# Patient Record
Sex: Female | Born: 2000 | Race: Black or African American | Hispanic: No | Marital: Single | Smoking: Never smoker
Health system: Southern US, Community
[De-identification: ages and names within clinical notes are randomized; demographics above are authoritative.]

---
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
## 2021-12-11 ENCOUNTER — Other Ambulatory Visit: Payer: Self-pay

## 2021-12-11 ENCOUNTER — Encounter (HOSPITAL_COMMUNITY): Payer: Self-pay

## 2021-12-11 ENCOUNTER — Emergency Department (HOSPITAL_COMMUNITY)
Admission: EM | Admit: 2021-12-11 | Discharge: 2021-12-12 | Disposition: A | Discharge status: Home / Self Care | Payer: Medicaid Other | Attending: Emergency Medicine | Admitting: Emergency Medicine

## 2021-12-11 ENCOUNTER — Emergency Department (HOSPITAL_COMMUNITY): Payer: Medicaid Other

## 2021-12-11 DIAGNOSIS — R1013 Epigastric pain: Secondary | ICD-10-CM | POA: Diagnosis present

## 2021-12-11 DIAGNOSIS — K529 Noninfective gastroenteritis and colitis, unspecified: Secondary | ICD-10-CM | POA: Insufficient documentation

## 2021-12-11 LAB — URINALYSIS, ROUTINE W REFLEX MICROSCOPIC
Bilirubin Urine: NEGATIVE
Glucose, UA: NEGATIVE mg/dL
Ketones, ur: NEGATIVE mg/dL
Nitrite: NEGATIVE
Protein, ur: NEGATIVE mg/dL
Specific Gravity, Urine: 1.012 (ref 1.005–1.030)
pH: 5 (ref 5.0–8.0)

## 2021-12-11 LAB — COMPREHENSIVE METABOLIC PANEL
ALT: 9 U/L (ref 0–44)
AST: 14 U/L — ABNORMAL LOW (ref 15–41)
Albumin: 3.7 g/dL (ref 3.5–5.0)
Alkaline Phosphatase: 54 U/L (ref 38–126)
Anion gap: 8 (ref 5–15)
BUN: 11 mg/dL (ref 6–20)
CO2: 25 mmol/L (ref 22–32)
Calcium: 8.9 mg/dL (ref 8.9–10.3)
Chloride: 103 mmol/L (ref 98–111)
Creatinine, Ser: 0.65 mg/dL (ref 0.44–1.00)
GFR, Estimated: >60 mL/min (ref >60)
Glucose, Bld: 103 mg/dL — ABNORMAL HIGH (ref 70–99)
Potassium: 3.7 mmol/L (ref 3.5–5.1)
Sodium: 136 mmol/L (ref 135–145)
Total Bilirubin: 0.5 mg/dL (ref 0.3–1.2)
Total Protein: 7.6 g/dL (ref 6.5–8.1)

## 2021-12-11 LAB — CBC WITH DIFFERENTIAL/PLATELET
Abs Immature Granulocytes: 0.05 10*3/uL (ref 0.00–0.07)
Basophils Absolute: 0 10*3/uL (ref 0.0–0.1)
Basophils Relative: 0 %
Eosinophils Absolute: 0 10*3/uL (ref 0.0–0.5)
Eosinophils Relative: 0 %
HCT: 30.6 % — ABNORMAL LOW (ref 36.0–46.0)
Hemoglobin: 10.1 g/dL — ABNORMAL LOW (ref 12.0–15.0)
Immature Granulocytes: 0 %
Lymphocytes Relative: 7 %
Lymphs Abs: 0.8 10*3/uL (ref 0.7–4.0)
MCH: 26.2 pg (ref 26.0–34.0)
MCHC: 33 g/dL (ref 30.0–36.0)
MCV: 79.5 fL — ABNORMAL LOW (ref 80.0–100.0)
Monocytes Absolute: 0.8 10*3/uL (ref 0.1–1.0)
Monocytes Relative: 7 %
Neutro Abs: 9.9 10*3/uL — ABNORMAL HIGH (ref 1.7–7.7)
Neutrophils Relative %: 86 %
Platelets: 338 10*3/uL (ref 150–400)
RBC: 3.85 MIL/uL — ABNORMAL LOW (ref 3.87–5.11)
RDW: 14.9 % (ref 11.5–15.5)
WBC: 11.6 10*3/uL — ABNORMAL HIGH (ref 4.0–10.5)
nRBC: 0 % (ref 0.0–0.2)

## 2021-12-11 LAB — I-STAT BETA HCG BLOOD, ED (MC, WL, AP ONLY): I-stat hCG, quantitative: <5 m[IU]/mL (ref <5)

## 2021-12-11 LAB — LIPASE, BLOOD: Lipase: 23 U/L (ref 11–51)

## 2021-12-11 MED ORDER — IOHEXOL 300 MG/ML  SOLN
100.0000 mL | Freq: Once | INTRAMUSCULAR | Status: AC | PRN
  Administered 2021-12-11: 80 mL via INTRAVENOUS

## 2021-12-11 NOTE — ED Triage Notes (Signed)
Pt arrives via EMS from home. Pt c/o abdominal cramping that started today. Denies any associated symptoms

## 2021-12-11 NOTE — ED Provider Triage Note (Signed)
Emergency Medicine Provider Triage Evaluation Note  Kelly Cisneros , a 21 y.o. female  was evaluated in triage.  Pt complains of abdominal pain.  Patient states that symptoms began Monday night.  She notes progressively worsening symptoms since around.  She states that symptoms began after eating [employer] around 1 PM on Monday.  She describes the pain as right upper quadrant in location without radiation.  She denies association with food since symptom onset.  She denies nausea, vomiting, fever, chills, night sweats, urinary/vaginal symptoms, medic easier, melena.  She does note that she has been having diarrhea for the past day.  Last menstrual period August 8.  She was seen by her primary care provider yesterday and was diagnosed with GERD and started on famotidine.  She states the medication has not helped and she has taken it once.  Review of Systems  Positive: See above Negative:   Physical Exam  BP 120/89 (BP Location: Right Arm)   Pulse (!) 106   Temp 100.3 F (37.9 C) (Oral)   Resp 20   SpO2 98%  Gen:   Awake, no distress   Resp:  Normal effort  MSK:   Moves extremities without difficulty  Other:  Mild epigastric tenderness on exam.  Medical Decision Making  Medically screening exam initiated at 7:25 PM.  Appropriate orders placed.  Kelly Cisneros was informed that the remainder of the evaluation will be completed by another provider, this initial triage assessment does not replace that evaluation, and the importance of remaining in the ED until their evaluation is complete.     Peter Garter, [place] 12/11/21 1927

## 2021-12-12 MED ORDER — DICYCLOMINE HCL 20 MG PO TABS: 20.0000 mg | ORAL_TABLET | Freq: Two times a day (BID) | ORAL | 0 refills | Status: DC

## 2021-12-12 MED ORDER — FENTANYL CITRATE PF 50 MCG/ML IJ SOSY
50.0000 ug | PREFILLED_SYRINGE | Freq: Once | INTRAMUSCULAR | Status: AC
  Administered 2021-12-12: 50 ug via INTRAVENOUS
  Filled 2021-12-12: qty 1

## 2021-12-12 MED ORDER — FAMOTIDINE 20 MG PO TABS: 20.0000 mg | ORAL_TABLET | Freq: Two times a day (BID) | ORAL | 0 refills | Status: DC

## 2021-12-12 MED ORDER — ONDANSETRON HCL 4 MG/2ML IJ SOLN
4.0000 mg | Freq: Once | INTRAMUSCULAR | Status: AC
  Administered 2021-12-12: 4 mg via INTRAVENOUS
  Filled 2021-12-12: qty 2

## 2021-12-12 MED ORDER — FAMOTIDINE 20 MG PO TABS
20.0000 mg | ORAL_TABLET | Freq: Two times a day (BID) | ORAL | 0 refills | Status: DC
Start: 2021-12-12 — End: 2021-12-12

## 2021-12-12 MED ORDER — SODIUM CHLORIDE 0.9 % IV BOLUS
1000.0000 mL | Freq: Once | INTRAVENOUS | Status: AC
  Administered 2021-12-12: 1000 mL via INTRAVENOUS

## 2021-12-12 MED ORDER — ONDANSETRON HCL 4 MG PO TABS: 4.0000 mg | ORAL_TABLET | Freq: Three times a day (TID) | ORAL | 0 refills | Status: DC | PRN

## 2021-12-12 NOTE — Discharge Instructions (Addendum)
Get help right away if you: Have chest pain. Have trouble breathing or you are breathing very quickly. Have a fast heartbeat. Feel extremely weak or you faint. Have a severe headache, a stiff neck, or both. Have a rash. Have severe pain, cramping, or bloating in your abdomen. Have skin that feels cold and clammy. Feel confused. Have pain when you urinate. Have signs of dehydration, such as: Dark urine, very little urine, or no urine. Cracked lips. Dry mouth. Sunken eyes. Sleepiness. Weakness. Have signs of bleeding, such as: Seeing blood in your vomit. Having vomit that looks like coffee grounds. Having bloody or black stools or stools that look like tar. 

## 2021-12-12 NOTE — ED Provider Notes (Signed)
[facility]-EMERGENCY DEPT Provider Note   CSN: [id number] Arrival date & time: 12/11/21  1847     History  Chief Complaint  Patient presents with   Abdominal Pain    Kelly Cisneros is a 21 y.o. female.  The history is provided by the patient.  Abdominal Pain Pain location:  Epigastric Pain quality: aching   Pain radiates to:  Does not radiate Pain severity:  Moderate Onset quality:  Gradual Duration:  2 days Timing:  Constant Progression:  Unchanged Chronicity:  New Context: not alcohol use, not awakening from sleep, not diet changes, not eating, not laxative use, not medication withdrawal, not previous surgeries, not recent illness, not recent sexual activity, not recent travel, not retching, not sick contacts, not suspicious food intake and not trauma   Relieved by:  None tried Worsened by:  Nothing Ineffective treatments:  None tried Associated symptoms: no chest pain, no chills, no constipation, no cough, no diarrhea, no dysuria, no fatigue, no fever, no hematemesis, no hematuria, no melena, no nausea, no sore throat and no vomiting        Home Medications Prior to Admission medications   Not on File      Allergies    Patient has no known allergies.    Review of Systems   Review of Systems  Constitutional:  Negative for chills, fatigue and fever.  HENT:  Negative for sore throat.   Respiratory:  Negative for cough.   Cardiovascular:  Negative for chest pain.  Gastrointestinal:  Positive for abdominal pain. Negative for constipation, diarrhea, hematemesis, melena, nausea and vomiting.  Genitourinary:  Negative for dysuria and hematuria.    Physical Exam Updated Vital Signs BP 111/74   Pulse 97   Temp 99.7 F (37.6 C) (Oral)   Resp 17   SpO2 100%  Physical Exam Vitals and nursing note reviewed.  Constitutional:      General: She is not in acute distress.    Appearance: She is well-developed. She is not diaphoretic.  HENT:      Head: Normocephalic and atraumatic.     Right Ear: External ear normal.     Left Ear: External ear normal.     Nose: Nose normal.     Mouth/Throat:     Mouth: Mucous membranes are moist.  Eyes:     General: No scleral icterus.    Conjunctiva/sclera: Conjunctivae normal.  Cardiovascular:     Rate and Rhythm: Normal rate and regular rhythm.     Heart sounds: Normal heart sounds. No murmur heard.    No friction rub. No gallop.  Pulmonary:     Effort: Pulmonary effort is normal. No respiratory distress.     Breath sounds: Normal breath sounds.  Abdominal:     General: Bowel sounds are normal. There is no distension.     Palpations: Abdomen is soft. There is no mass.     Tenderness: There is generalized abdominal tenderness. There is no guarding.  Musculoskeletal:     Cervical back: Normal range of motion.  Skin:    General: Skin is warm and dry.  Neurological:     Mental Status: She is alert and oriented to person, place, and time.  Psychiatric:        Behavior: Behavior normal.     ED Results / Procedures / Treatments   Labs (all labs ordered are listed, but only abnormal results are displayed) Labs Reviewed  CBC WITH DIFFERENTIAL/PLATELET - Abnormal; Notable for the following components:  Result Value   WBC 11.6 (*)    RBC 3.85 (*)    Hemoglobin 10.1 (*)    HCT 30.6 (*)    MCV 79.5 (*)    Neutro Abs 9.9 (*)    All other components within normal limits  COMPREHENSIVE METABOLIC PANEL - Abnormal; Notable for the following components:   Glucose, Bld 103 (*)    AST 14 (*)    All other components within normal limits  URINALYSIS, ROUTINE W REFLEX MICROSCOPIC - Abnormal; Notable for the following components:   APPearance HAZY (*)    Hgb urine dipstick MODERATE (*)    Leukocytes,Ua MODERATE (*)    Bacteria, UA RARE (*)    All other components within normal limits  LIPASE, BLOOD  I-STAT BETA HCG BLOOD, ED ([facility], [facility], [facility] ONLY)  I-STAT BETA HCG BLOOD, ED ([facility], [facility], [facility] ONLY)     EKG None  Radiology CT Abdomen Pelvis W Contrast  Result Date: 12/11/2021 CLINICAL DATA:  Abdominal cramping. EXAM: CT ABDOMEN AND PELVIS WITH CONTRAST TECHNIQUE: Multidetector CT imaging of the abdomen and pelvis was performed using the standard protocol following bolus administration of intravenous contrast. RADIATION DOSE REDUCTION: This exam was performed according to the departmental dose-optimization program which includes automated exposure control, adjustment of the mA and/or kV according to patient size and/or use of iterative reconstruction technique. CONTRAST:  46mL OMNIPAQUE IOHEXOL 300 MG/ML  SOLN COMPARISON:  None Available. FINDINGS: Lower chest: No acute abnormality. Hepatobiliary: No focal liver abnormality is seen. No gallstones, gallbladder wall thickening, or biliary dilatation. Pancreas: Unremarkable. No pancreatic ductal dilatation or surrounding inflammatory changes. Spleen: Normal in size without focal abnormality. Adrenals/Urinary Tract: Adrenal glands are unremarkable. Kidneys are normal, without renal calculi, focal lesion, or hydronephrosis. Bladder is unremarkable. Stomach/Bowel: Stomach is within normal limits. Appendix appears normal. No evidence of bowel dilatation. Mildly thickened and inflamed loops of jejunum are seen the mid and lower abdomen. Vascular/Lymphatic: No significant vascular findings are present. No enlarged abdominal or pelvic lymph nodes. Reproductive: Uterus and bilateral adnexa are unremarkable. Other: No abdominal wall hernia or abnormality. No abdominopelvic ascites. Musculoskeletal: No acute or significant osseous findings. IMPRESSION: Mild enteritis involving multiple loops of jejunum. Electronically Signed   By: Aram Candela M.D.   On: 12/11/2021 20:57    Procedures Procedures    Medications Ordered in ED Medications  iohexol (OMNIPAQUE) 300 MG/ML solution 100 mL (80 mLs Intravenous Contrast Given 12/11/21 2028)  sodium chloride 0.9 %  bolus 1,000 mL (1,000 mLs Intravenous New Bag/Given 12/12/21 0157)  fentaNYL (SUBLIMAZE) injection 50 mcg (50 mcg Intravenous Given 12/12/21 0157)  ondansetron (ZOFRAN) injection 4 mg (4 mg Intravenous Given 12/12/21 0157)    ED Course/ Medical Decision Making/ A&P                           Medical Decision Making Patient here with complaint of epigastric abdominal pain for the past 24 hours. Differential diagnosis of epigastric pain includes: Functional or nonulcer dyspepsia PUD, GERD, Gastritis, (NSAIDs, alcohol, stress, H. pylori, pernicious anemia), pancreatitis or pancreatic cancer, overeating indigestion (high-fat foods, coffee), drugs (aspirin, antibiotics (eg, macrolides, metronidazole), corticosteroids, digoxin, narcotics, theophylline), gastroparesis, lactose intolerance, malabsorption gastric cancer, parasitic infection, (Giardia, Strongyloides, Ascaris) cholelithiasis, choledocholithiasis, or cholangitis, ACS, pericarditis, pneumonia, abdominal hernia, pregnancy, intestinal ischemia, esophageal rupture, gastric volvulus, hepatitis.  I reviewed and interpreted the patient's labs which which are reassuring.  I visualized and interpreted CT abdomen and pelvis ordered  at triage which shows findings consistent with enteritis  Patient with symptoms consistent with enteritis.  Vitals are stable, no fever.  Pain addressed and resolved, tolerating PO fluids > 6 oz.  Lungs are clear.   no concern for appendicitis, cholecystitis, pancreatitis, ruptured viscus, UTI, kidney stone, or any other abdominal etiology.  Supportive therapy indicated with return if symptoms worsen.  Patient counseled.   Amount and/or Complexity of Data Reviewed Radiology: independent interpretation performed.  Risk Prescription drug management. Parenteral controlled substances.           Final Clinical Impression(s) / ED Diagnoses Final diagnoses:  Enteritis    Rx / DC Orders ED Discharge Orders      None         Arthor Captain, PA-C 12/12/21 1706    Palumbo, April, MD 12/12/21 2319

## 2022-10-09 ENCOUNTER — Ambulatory Visit
Admission: RE | Admit: 2022-10-09 | Discharge: 2022-10-09 | Disposition: A | Discharge status: Home / Self Care | Payer: Medicaid Other | Source: Ambulatory Visit | Attending: Family Medicine | Admitting: Family Medicine

## 2022-10-09 ENCOUNTER — Other Ambulatory Visit: Payer: Self-pay

## 2022-10-09 VITALS — BP 98/67 | HR 80 | Temp 98.9°F | Resp 16

## 2022-10-09 DIAGNOSIS — N898 Other specified noninflammatory disorders of vagina: Secondary | ICD-10-CM

## 2022-10-09 NOTE — ED Provider Notes (Signed)
[facility]    CSN: [id number] Arrival date & time: 10/09/22  1356      History   Chief Complaint Chief Complaint  Patient presents with   Vaginal Discharge    Entered by patient   Vaginal Itching   Vaginal Pain    HPI Kelly Cisneros is a 22 y.o. female.   HPI  She is seen today with a complaint of vaginal discharge.  She reports that she has recently been treated for bacterial vaginosis along with a yeast infection.  She feels like the symptoms have not resolved.  She does have occasional pain outside of her vaginal area. Denies any pelvic pain or tenderness, amenorrhea irregular bleeding or prolonged heavy bleeding.  Denies dysuria.  Denies ulcers or lesions  History reviewed. No pertinent past medical history.  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 No pertinent surgical history.  OB History   No obstetric history on file.      Home Medications    Prior to Admission medications   Not on File    Family History History reviewed. No pertinent family history.  Social History Social History   Tobacco Use   Smoking status: Never   Smokeless tobacco: Never  Substance Use Topics   Alcohol use: Not Currently   Drug use: Not Currently    Types: Marijuana     Allergies   Patient has no known allergies.   Review of Systems Review of Systems   Physical Exam Triage Vital Signs ED Triage Vitals  Enc Vitals Group     BP 10/09/22 1425 98/67     Pulse Rate 10/09/22 1425 80     Resp 10/09/22 1425 16     Temp 10/09/22 1425 98.9 F (37.2 C)     Temp src --      SpO2 10/09/22 1425 97 %     Weight --      Height --      Head Circumference --      Peak Flow --      Pain Score 10/09/22 1423 4     Pain Loc --      Pain Edu? --       Excl. in GC? --    No data found.  Updated Vital Signs BP 98/67   Pulse 80   Temp 98.9 F (37.2 C)   Resp 16   LMP 09/16/2022   SpO2 97%   Visual Acuity Right Eye Distance:   Left Eye Distance:   Bilateral Distance:    Right Eye Near:   Left Eye Near:    Bilateral Near:     Physical Exam Constitutional:      General: She is not in acute distress.    Appearance: She is normal weight. She is not ill-appearing.  HENT:     Head: Normocephalic and atraumatic.  Cardiovascular:     Rate and Rhythm: Normal rate.  Pulmonary:     Effort: Pulmonary effort is normal.  Genitourinary:    Comments: Deferred Musculoskeletal:        General: Normal range of motion.  Skin:    General: Skin is warm and dry.     Capillary Refill: Capillary refill takes less than 2 seconds.  Neurological:     General:  No focal deficit present.     Mental Status: She is alert and oriented to person, place, and time.  Psychiatric:        Mood and Affect: Mood normal.        Behavior: Behavior normal.      UC Treatments / Results  Labs (all labs ordered are listed, but only abnormal results are displayed) Labs Reviewed  CERVICOVAGINAL ANCILLARY ONLY    EKG   Radiology No results found.  Procedures Procedures (including critical care time)  Medications Ordered in UC Medications - No data to display  Initial Impression / Assessment and Plan / UC Course  I have reviewed the triage vital signs and the nursing notes.  Pertinent labs & imaging results that were available during my care of the patient were reviewed by me and considered in my medical decision making (see chart for details).     Vaginal discharge Final Clinical Impressions(s) / UC Diagnoses   Final diagnoses:  Vaginal discharge     Discharge Instructions      Your vaginal swab is pending. You will be notified by the nurse with any positive results.   A nurse will follow up with you for any positive results for  further treatment recommendations. If your symptoms persist or get worse please follow up here or with your gynecologist for further evaluation       ED Prescriptions   None    PDMP not reviewed this encounter.   Thad Ranger [place], [place] 10/09/22 [phone]

## 2022-10-09 NOTE — ED Triage Notes (Signed)
Pt reports vag itching,discharge and pain for last 2-3 months.

## 2022-10-09 NOTE — Discharge Instructions (Addendum)
Your vaginal swab is pending. You will be notified by the nurse with any positive results.  A nurse will follow up with you for any positive results for further treatment recommendations.  If your symptoms persist or get worse please follow up here or with your gynecologist for further evaluation    

## 2022-10-10 LAB — CERVICOVAGINAL ANCILLARY ONLY
Bacterial Vaginitis (gardnerella): POSITIVE — AB
Candida Glabrata: NEGATIVE
Candida Vaginitis: POSITIVE — AB
Chlamydia: NEGATIVE
Comment: NEGATIVE
Comment: NEGATIVE
Comment: NEGATIVE
Comment: NEGATIVE
Comment: NEGATIVE
Comment: NORMAL
Neisseria Gonorrhea: NEGATIVE
Trichomonas: NEGATIVE

## 2022-10-13 ENCOUNTER — Telehealth (HOSPITAL_COMMUNITY): Payer: Self-pay | Admitting: Emergency Medicine

## 2022-10-13 MED ORDER — FLUCONAZOLE 150 MG PO TABS: 150.0000 mg | ORAL_TABLET | Freq: Once | ORAL | 0 refills | Status: AC

## 2022-10-13 MED ORDER — METRONIDAZOLE 0.75 % VA GEL: 1.0000 | Freq: Every day | VAGINAL | 0 refills | Status: AC

## 2022-11-10 ENCOUNTER — Ambulatory Visit
Admission: RE | Admit: 2022-11-10 | Discharge: 2022-11-10 | Disposition: A | Discharge status: Home / Self Care | Payer: Medicaid Other | Source: Ambulatory Visit | Attending: Emergency Medicine | Admitting: Emergency Medicine

## 2022-11-10 VITALS — BP 106/65 | HR 82 | Temp 98.3°F | Resp 16 | Ht 60.0 in | Wt 95.0 lb

## 2022-11-10 DIAGNOSIS — N898 Other specified noninflammatory disorders of vagina: Secondary | ICD-10-CM | POA: Insufficient documentation

## 2022-11-10 DIAGNOSIS — R102 Pelvic and perineal pain: Secondary | ICD-10-CM | POA: Diagnosis not present

## 2022-11-10 LAB — POCT URINALYSIS DIP (MANUAL ENTRY)
Bilirubin, UA: NEGATIVE
Blood, UA: NEGATIVE
Glucose, UA: NEGATIVE mg/dL
Ketones, POC UA: NEGATIVE mg/dL
Nitrite, UA: NEGATIVE
Protein Ur, POC: NEGATIVE mg/dL
Spec Grav, UA: 1.02 (ref 1.010–1.025)
Urobilinogen, UA: 0.2 E.U./dL
pH, UA: 7 (ref 5.0–8.0)

## 2022-11-10 LAB — POCT URINE PREGNANCY: Preg Test, Ur: NEGATIVE

## 2022-11-10 MED ORDER — FLUCONAZOLE 150 MG PO TABS: 150.0000 mg | ORAL_TABLET | Freq: Every day | ORAL | 0 refills | Status: AC

## 2022-11-10 NOTE — ED Provider Notes (Signed)
[facility]    CSN: [id number] Arrival date & time: 11/10/22  1128      History   Chief Complaint Chief Complaint  Patient presents with   Vaginal Discharge    And low inguinal/pelvic pain (right side)- Entered by patient    HPI Kelly Cisneros is a 22 y.o. female.   Patient presents for evaluation of yellow to green vaginal discharge and vaginal itching present for 3 days.  Accompanying intermittent pelvic pain.  Sexually active, 1 partner, no known exposure.  2 to 3 weeks ago treated for bacterial vaginosis and vaginal yeast with Diflucan and metronidazole, completed treatment as prescribed.  Endorses that symptoms did improve but vaginal itching has returned.  Endorses that over the last 4 to 5 months she has had intermittent vaginal itching and never feels as if the symptoms fully resolve.  Denies urinary symptoms, flank pain.   History reviewed. No pertinent past medical history.  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 No pertinent surgical history.  OB History   No obstetric history on file.      Home Medications    Prior to Admission medications   Medication Sig Start Date End Date Taking? Authorizing Provider  fluconazole (DIFLUCAN) 150 MG tablet Take 150 mg by mouth once. 09/23/22  Yes [provider]  levonorgestrel-ethinyl estradiol (ALESSE) 0.1-20 MG-MCG tablet Take 1 tablet by mouth daily. 07/11/21  Yes [provider]  metroNIDAZOLE (FLAGYL) 500 MG tablet Take 500 mg by mouth 2 (two) times daily. 07/26/22  Yes [provider]  oseltamivir (TAMIFLU) 75 MG capsule Take 75 mg by mouth 2 (two) times daily. 05/21/22  Yes [provider]  predniSONE (DELTASONE) 20 MG tablet Take 20 mg by mouth 2 (two) times daily.  05/21/22  Yes [provider]    Family History History reviewed. No pertinent family history.  Social History Social History   Tobacco Use   Smoking status: Never   Smokeless tobacco: Never  Vaping Use   Vaping status: Never Used  Substance Use Topics   Alcohol use: Not Currently   Drug use: Not Currently    Types: Marijuana     Allergies   Patient has no known allergies.   Review of Systems Review of Systems  Constitutional: Negative.   Respiratory: Negative.    Cardiovascular: Negative.   Genitourinary:  Positive for pelvic pain and vaginal discharge. Negative for decreased urine volume, difficulty urinating, dyspareunia, dysuria, enuresis, flank pain, frequency, genital sores, hematuria, menstrual problem, urgency, vaginal bleeding and vaginal pain.  Skin: Negative.      Physical Exam Triage Vital Signs ED Triage Vitals  Encounter Vitals Group     BP 11/10/22 1144 106/65     Systolic BP Percentile --      Diastolic BP Percentile --      Pulse Rate 11/10/22 1144 82     Resp 11/10/22 1144 16     Temp 11/10/22 1144 98.3 F (36.8 C)     Temp Source 11/10/22 1144 Oral     SpO2 11/10/22 1144 99 %     Weight 11/10/22 1142 95 lb (43.1 kg)     Height 11/10/22 1142 5' (1.524 m)     Head Circumference --      Peak Flow --  Pain Score 11/10/22 1141 6     Pain Loc --      Pain Education --      Exclude from Growth Chart --    No data found.  Updated Vital Signs BP 106/65 (BP Location: Left Arm)   Pulse 82   Temp 98.3 F (36.8 C) (Oral)   Resp 16   Ht 5' (1.524 m)   Wt 95 lb (43.1 kg)   LMP 10/23/2022 (Exact Date)   SpO2 99%   BMI 18.55 kg/m   Visual Acuity Right Eye Distance:   Left Eye Distance:   Bilateral Distance:    Right Eye Near:   Left Eye Near:    Bilateral Near:     Physical Exam Constitutional:      Appearance: Normal appearance.  Eyes:     Extraocular Movements: Extraocular movements intact.  Pulmonary:     Effort:  Pulmonary effort is normal.  Abdominal:     General: Abdomen is flat. Bowel sounds are normal.     Palpations: Abdomen is soft.     Tenderness: There is abdominal tenderness in the suprapubic area.  Genitourinary:    Comments: deferred Neurological:     Mental Status: She is alert and oriented to person, place, and time. Mental status is at baseline.      UC Treatments / Results  Labs (all labs ordered are listed, but only abnormal results are displayed) Labs Reviewed  POCT URINALYSIS DIP (MANUAL ENTRY) - Abnormal; Notable for the following components:      Result Value   Leukocytes, UA Small (1+) (*)    All other components within normal limits  CERVICOVAGINAL ANCILLARY ONLY    EKG   Radiology No results found.  Procedures Procedures (including critical care time)  Medications Ordered in UC Medications - No data to display  Initial Impression / Assessment and Plan / UC Course  I have reviewed the triage vital signs and the nursing notes.  Pertinent labs & imaging results that were available during my care of the patient were reviewed by me and considered in my medical decision making (see chart for details).  Vaginal discharge  Prophylactically treating for yeast, Diflucan sent to pharmacy, discussed administration, remaining STI labs pending, will treat per protocol ,urinalysis and urine pregnancy test negative, advised abstinence until lab results, treatment is complete and all symptoms have resolved discussed additional supportive measures and advised patient to monitor for triggers related to reoccurring yeast infections, may follow-up with primary doctor for reevaluation as needed Final Clinical Impressions(s) / UC Diagnoses   Final diagnoses:  None   Discharge Instructions   None    ED Prescriptions   None    PDMP not reviewed this encounter.   Valinda Hoar, NP 11/10/22 1214

## 2022-11-10 NOTE — Discharge Instructions (Signed)
Today you are being treated prophylactically for yeast.   Take diflucan 150 mg once, if symptoms still present in 3 days then you may take second pill, save the third tablet until your test results have come back, if you test positive for an additional infection, take the third tablet after you have used all antibiotics  Yeast infections which are caused by a naturally occurring fungus called candida. Vaginosis is an inflammation of the vagina that can result in discharge, itching and pain. The cause is usually a change in the normal balance of vaginal bacteria or an infection. Vaginosis can also result from reduced estrogen levels after menopause.  Labs pending, you will be contacted if positive for any sti and treatment will be sent to the pharmacy, you will have to return to the clinic if positive for gonorrhea to receive treatment   Please refrain from having sex until labs results, if positive please refrain from having sex until treatment complete and symptoms resolve   If positive for , Chlamydia  gonorrhea or trichomoniasis please notify partner or partners so they may tested as well  Moving forward, it is recommended you use some form of protection against the transmission of sti infections  such as condoms or dental dams with each sexual encounter    In addition:   Avoid baths, hot tubs and whirlpool spas.  Don't use scented or harsh soaps, such as those with deodorant or antibacterial action. Avoid irritants. These include scented tampons and pads. Wipe from front to back after using the toilet.  Don't douche. Your vagina doesn't require cleansing other than normal bathing.  Use a  condom. Wear cotton underwear, this fabric helps absorb moisture

## 2022-11-10 NOTE — ED Triage Notes (Signed)
"  I am have been having right pelvic/inguinal pain with yellow/green discharge from vaginal area with recent treatment of BV".

## 2022-11-11 LAB — CERVICOVAGINAL ANCILLARY ONLY
Bacterial Vaginitis (gardnerella): POSITIVE — AB
Candida Glabrata: NEGATIVE
Candida Vaginitis: POSITIVE — AB
Chlamydia: NEGATIVE
Comment: NEGATIVE
Comment: NEGATIVE
Comment: NEGATIVE
Comment: NEGATIVE
Comment: NEGATIVE
Comment: NORMAL
Neisseria Gonorrhea: NEGATIVE
Trichomonas: NEGATIVE

## 2022-11-12 ENCOUNTER — Telehealth (HOSPITAL_COMMUNITY): Payer: Self-pay | Admitting: Emergency Medicine

## 2022-11-12 MED ORDER — METRONIDAZOLE 500 MG PO TABS: 500.0000 mg | ORAL_TABLET | Freq: Two times a day (BID) | ORAL | 0 refills | Status: DC

## 2023-10-02 ENCOUNTER — Ambulatory Visit
Admission: EM | Admit: 2023-10-02 | Discharge: 2023-10-02 | Disposition: A | Discharge status: Home / Self Care | Attending: Family Medicine | Admitting: Family Medicine

## 2023-10-02 DIAGNOSIS — B9689 Other specified bacterial agents as the cause of diseases classified elsewhere: Secondary | ICD-10-CM | POA: Diagnosis present

## 2023-10-02 DIAGNOSIS — N760 Acute vaginitis: Secondary | ICD-10-CM | POA: Diagnosis present

## 2023-10-02 LAB — POCT URINE PREGNANCY: Preg Test, Ur: NEGATIVE

## 2023-10-02 MED ORDER — METRONIDAZOLE 0.75 % VA GEL: 1.0000 | Freq: Every day | VAGINAL | 0 refills | Status: AC

## 2023-10-02 NOTE — ED Triage Notes (Signed)
 Patient presents with vaginal discharge X 3 days. Patient thinks she has BV.

## 2023-10-02 NOTE — ED Provider Notes (Signed)
  [facility]  Note:  This document was prepared using Conservation officer, historic buildings and may include unintentional dictation errors.  MRN: [id number] DOB: 07/12/2000  Subjective:   Kelly Cisneros is a 23 y.o. female presenting for 3 day history of recurrent bacterial vaginosis.  Patient reports that she was battling a yeast infection for some time.  After this was treated then she started having a difficult time with BV infection.  Last episode was about 2 months ago.  Denies fever, n/v, abdominal pain, pelvic pain, rashes, dysuria, urinary frequency, hematuria.    No chronic medications.    No Known Allergies  History reviewed. No pertinent past medical history.   History reviewed. No pertinent surgical history.  No family history on file.  Social History   Tobacco Use   Smoking status: Never   Smokeless tobacco: Never  Vaping Use   Vaping status: Never Used  Substance Use Topics   Alcohol use: Not Currently   Drug use: Not Currently    Types: Marijuana    ROS   Objective:   Vitals: BP 117/79 (BP Location: Left Arm)   Pulse 83   Temp 99.5 F (37.5 C) (Oral)   Resp 18   LMP 09/25/2023 (Approximate)   SpO2 98%   Physical Exam Constitutional:      General: She is not in acute distress.    Appearance: Normal appearance. She is well-developed. She is not ill-appearing, toxic-appearing or diaphoretic.  HENT:     Head: Normocephalic and atraumatic.     Nose: Nose normal.     Mouth/Throat:     Mouth: Mucous membranes are moist.  Eyes:     General: No scleral icterus.       Right eye: No discharge.        Left eye: No discharge.     Extraocular Movements: Extraocular movements intact.  Cardiovascular:     Rate and Rhythm: Normal rate.  Pulmonary:     Effort: Pulmonary effort is normal.  Skin:    General: Skin is warm and dry.  Neurological:     General: No focal deficit present.     Mental Status: She is alert and oriented to  person, place, and time.  Psychiatric:        Mood and Affect: Mood normal.        Behavior: Behavior normal.     Results for orders placed or performed during the hospital encounter of 10/02/23 (from the past 24 hours)  POCT urine pregnancy     Status: Normal   Collection Time: 10/02/23  3:58 PM  Result Value Ref Range   Preg Test, Ur Negative     Assessment and Plan :   PDMP not reviewed this encounter.  1. Bacterial vaginosis    Patient request empiric treatment for BV infection.  Does better with MetroGel .  This was prescribed to her.  I did offer fluconazole  in the event that she develops signs of a yeast infection but we will base this off of her progression and test results.  Counseled patient on potential for adverse effects with medications prescribed/recommended today, ER and return-to-clinic precautions discussed, patient verbalized understanding.    Adolph Hoop, [place] 10/02/23 1625

## 2023-10-05 ENCOUNTER — Ambulatory Visit (HOSPITAL_COMMUNITY): Payer: Self-pay

## 2023-10-05 LAB — CERVICOVAGINAL ANCILLARY ONLY
Bacterial Vaginitis (gardnerella): POSITIVE — AB
Candida Glabrata: NEGATIVE
Candida Vaginitis: NEGATIVE
Chlamydia: NEGATIVE
Comment: NEGATIVE
Comment: NEGATIVE
Comment: NEGATIVE
Comment: NEGATIVE
Comment: NEGATIVE
Comment: NORMAL
Neisseria Gonorrhea: NEGATIVE
Trichomonas: NEGATIVE
